# Patient Record
Sex: Male | Born: 1985 | Race: White | Hispanic: No | Marital: Married
Health system: Southern US, Community
[De-identification: ages and names within clinical notes are randomized; demographics above are authoritative.]

---

## 2006-02-19 ENCOUNTER — Emergency Department (HOSPITAL_COMMUNITY): Admission: EM | Admit: 2006-02-19 | Discharge: 2006-02-19 | Payer: Self-pay | Admitting: Emergency Medicine

## 2006-04-22 ENCOUNTER — Emergency Department (HOSPITAL_COMMUNITY): Admission: EM | Admit: 2006-04-22 | Discharge: 2006-04-22 | Payer: Self-pay | Admitting: Family Medicine

## 2006-04-25 ENCOUNTER — Emergency Department (HOSPITAL_COMMUNITY): Admission: EM | Admit: 2006-04-25 | Discharge: 2006-04-25 | Payer: Self-pay | Admitting: Emergency Medicine

## 2006-04-27 ENCOUNTER — Ambulatory Visit: Payer: Self-pay | Admitting: Internal Medicine

## 2006-05-05 ENCOUNTER — Ambulatory Visit: Payer: Self-pay | Admitting: Internal Medicine

## 2006-05-16 ENCOUNTER — Ambulatory Visit: Payer: Self-pay | Admitting: Internal Medicine

## 2007-07-14 IMAGING — CT CT HEAD W/O CM
1 series · 16 of 30 positions shown, 20 images · IV contrast (agent unspecified)
Comparison: None

CLINICAL DATA: Medical clearance.
 HEAD CT WITHOUT CONTRAST:
TECHNIQUE: Contiguous axial images were obtained from the base of the skull through the vertex according to standard protocol without contrast.

[Series 2: head_seq 4.5 h45s st · axial · 0.43mm/px · z∈[-180,-36]mm · 16 of 36 slices shown, 20 images]
[im 2/36  brain]
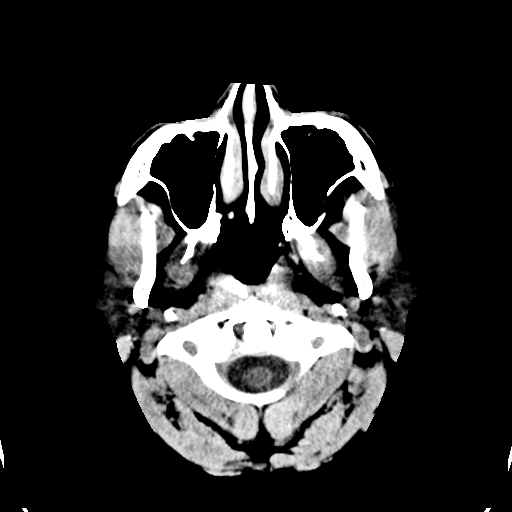
[im 2/36  bone]
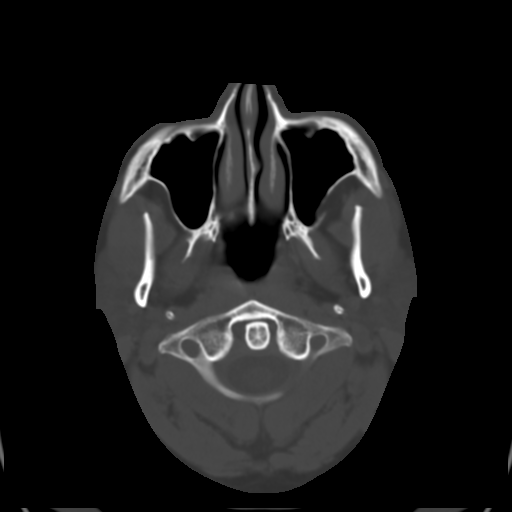
[im 4/36  brain]
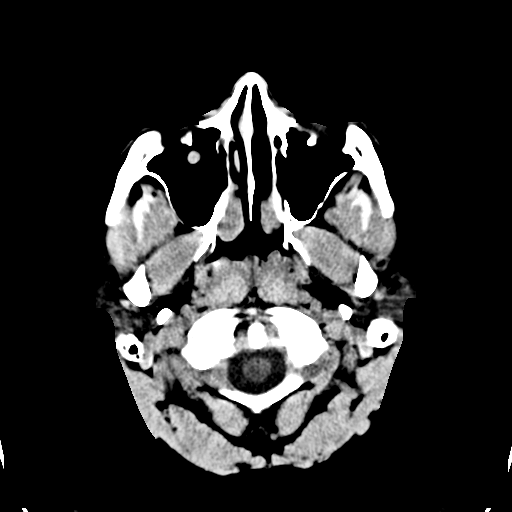
[im 7/36  brain]
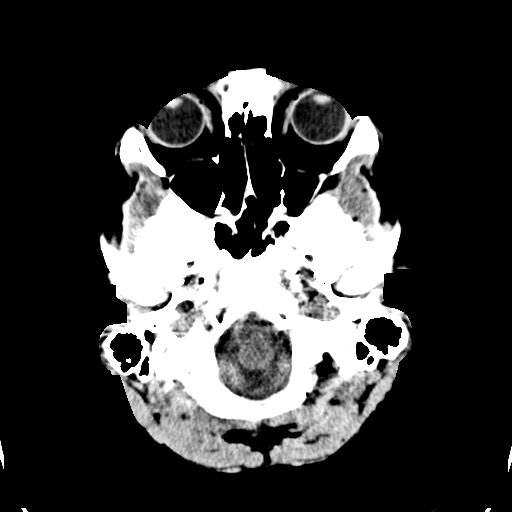
[im 9/36  brain]
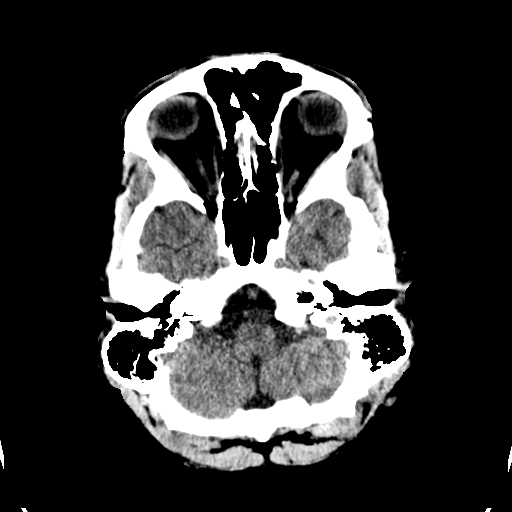
[im 10/36  brain]
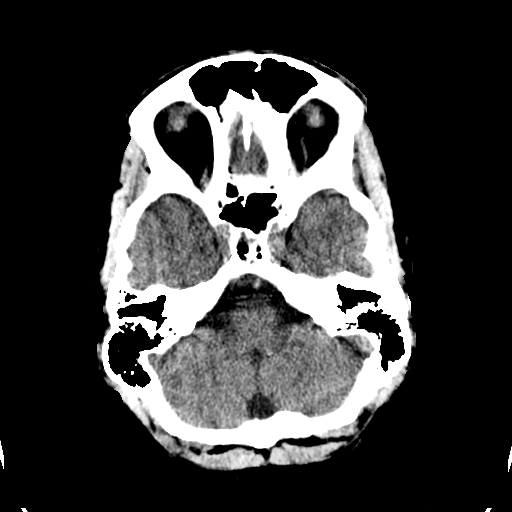
[im 10/36  bone]
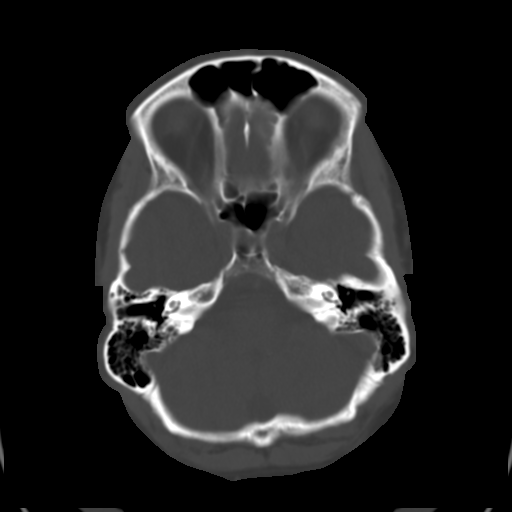
[im 13/36  brain]
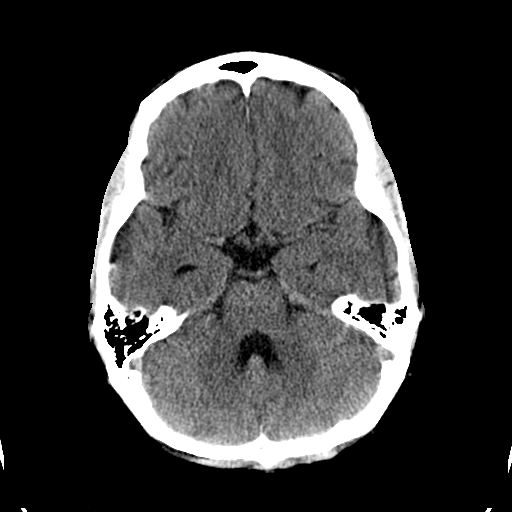
[im 15/36  brain]
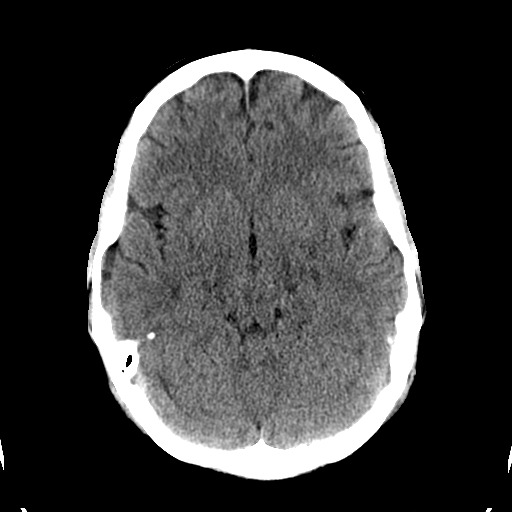
[im 17/36  brain]
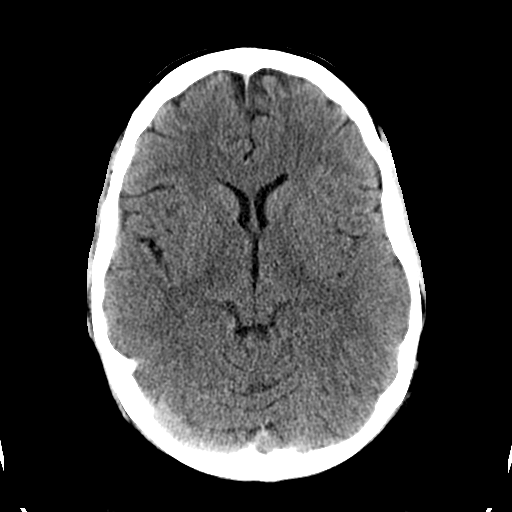
[im 19/36  brain]
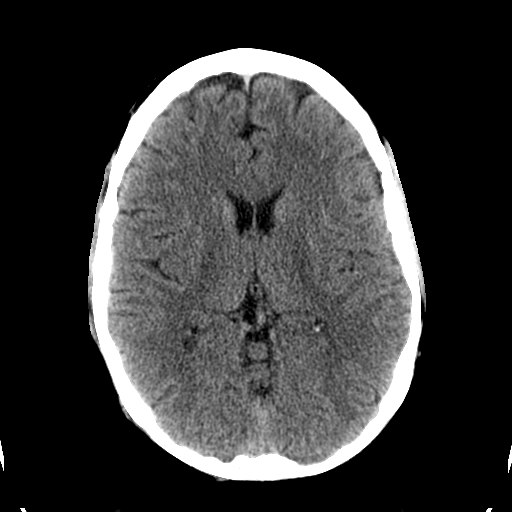
[im 19/36  bone]
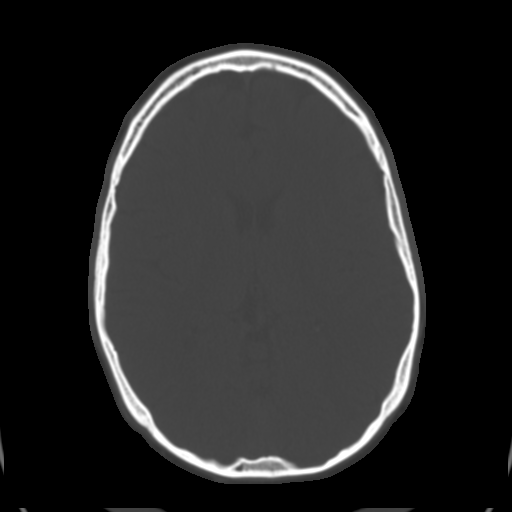
[im 21/36  brain]
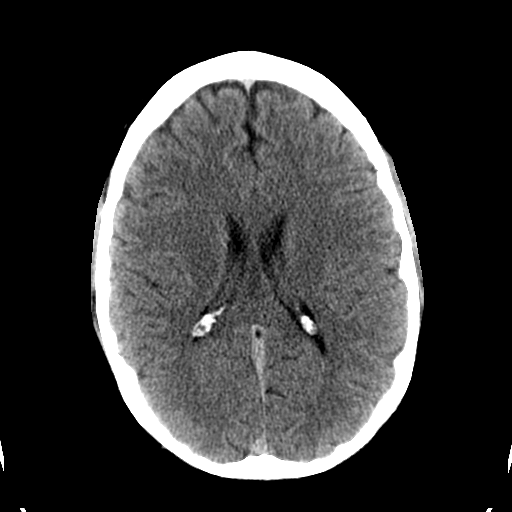
[im 23/36  brain]
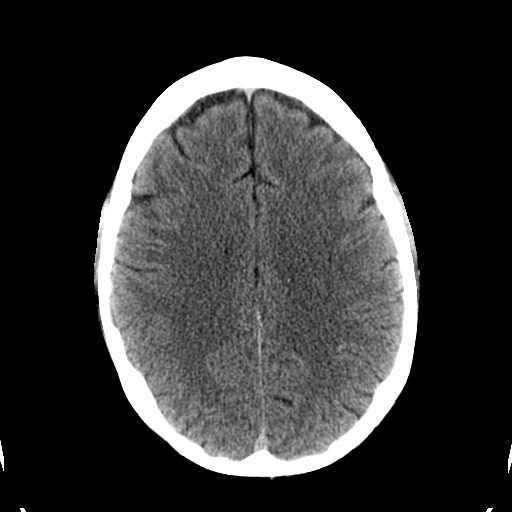
[im 26/36  brain]
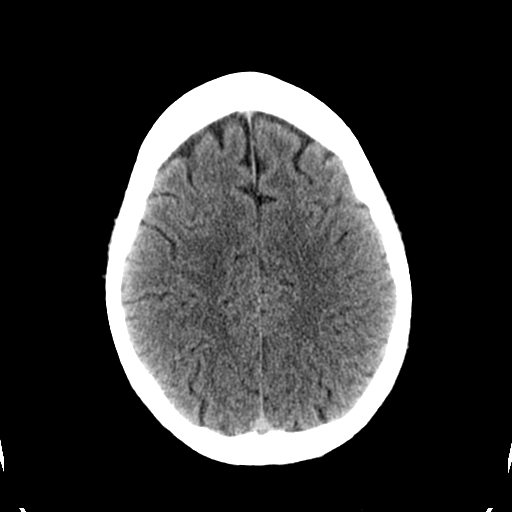
[im 27/36  brain]
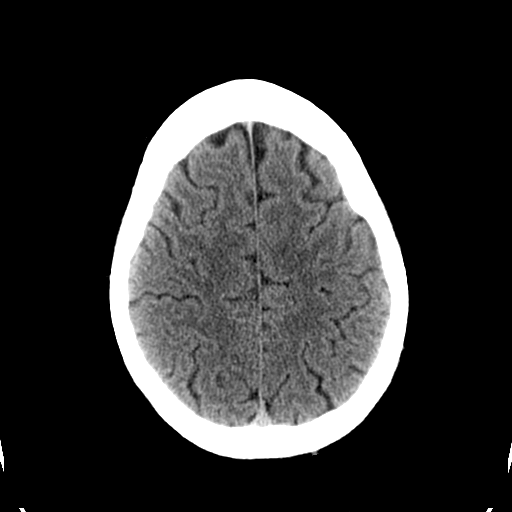
[im 27/36  bone]
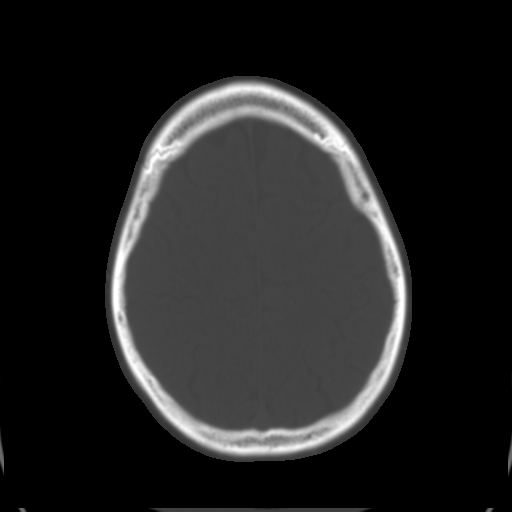
[im 29/36  brain]
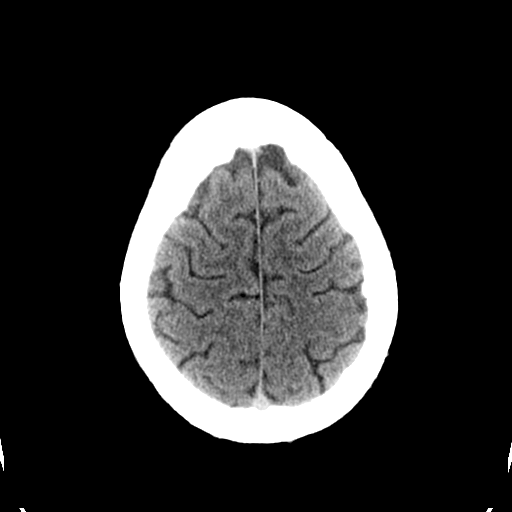
[im 32/36  brain]
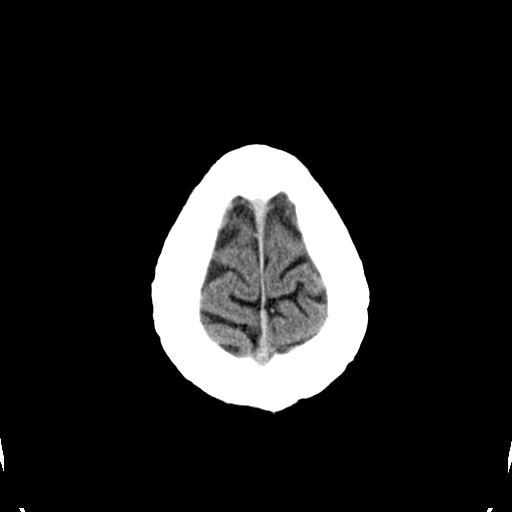
[im 34/36  brain]
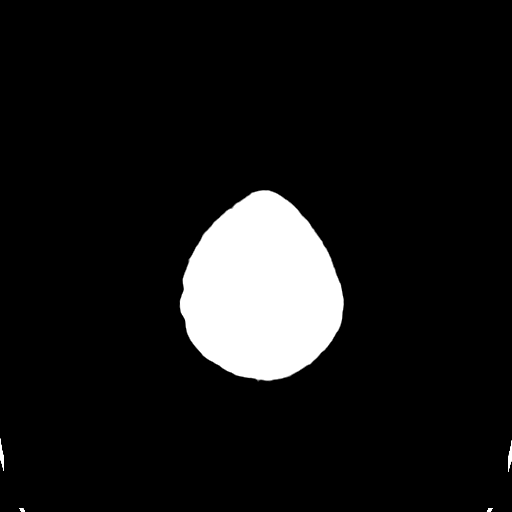

[16 of 30 positions shown; findings below may reference images not displayed]

FINDINGS: The brain parenchyma is normal in attenuation and morphology.  The ventricular volumes are within normal limits.  The midline is maintained.  There is no edema or mass effect.  
 The mastoid air cells and the paranasal sinuses are normally aerated.
IMPRESSION: No acute intracranial abnormalities.

## 2007-09-17 IMAGING — CR DG CHEST 2V
2 series · 2 of 2 positions shown · non-contrast
Comparison: 02/19/06.

CLINICAL DATA: Short of breath, cough.
 CHEST ? 2 VIEW:

[w chest pa]
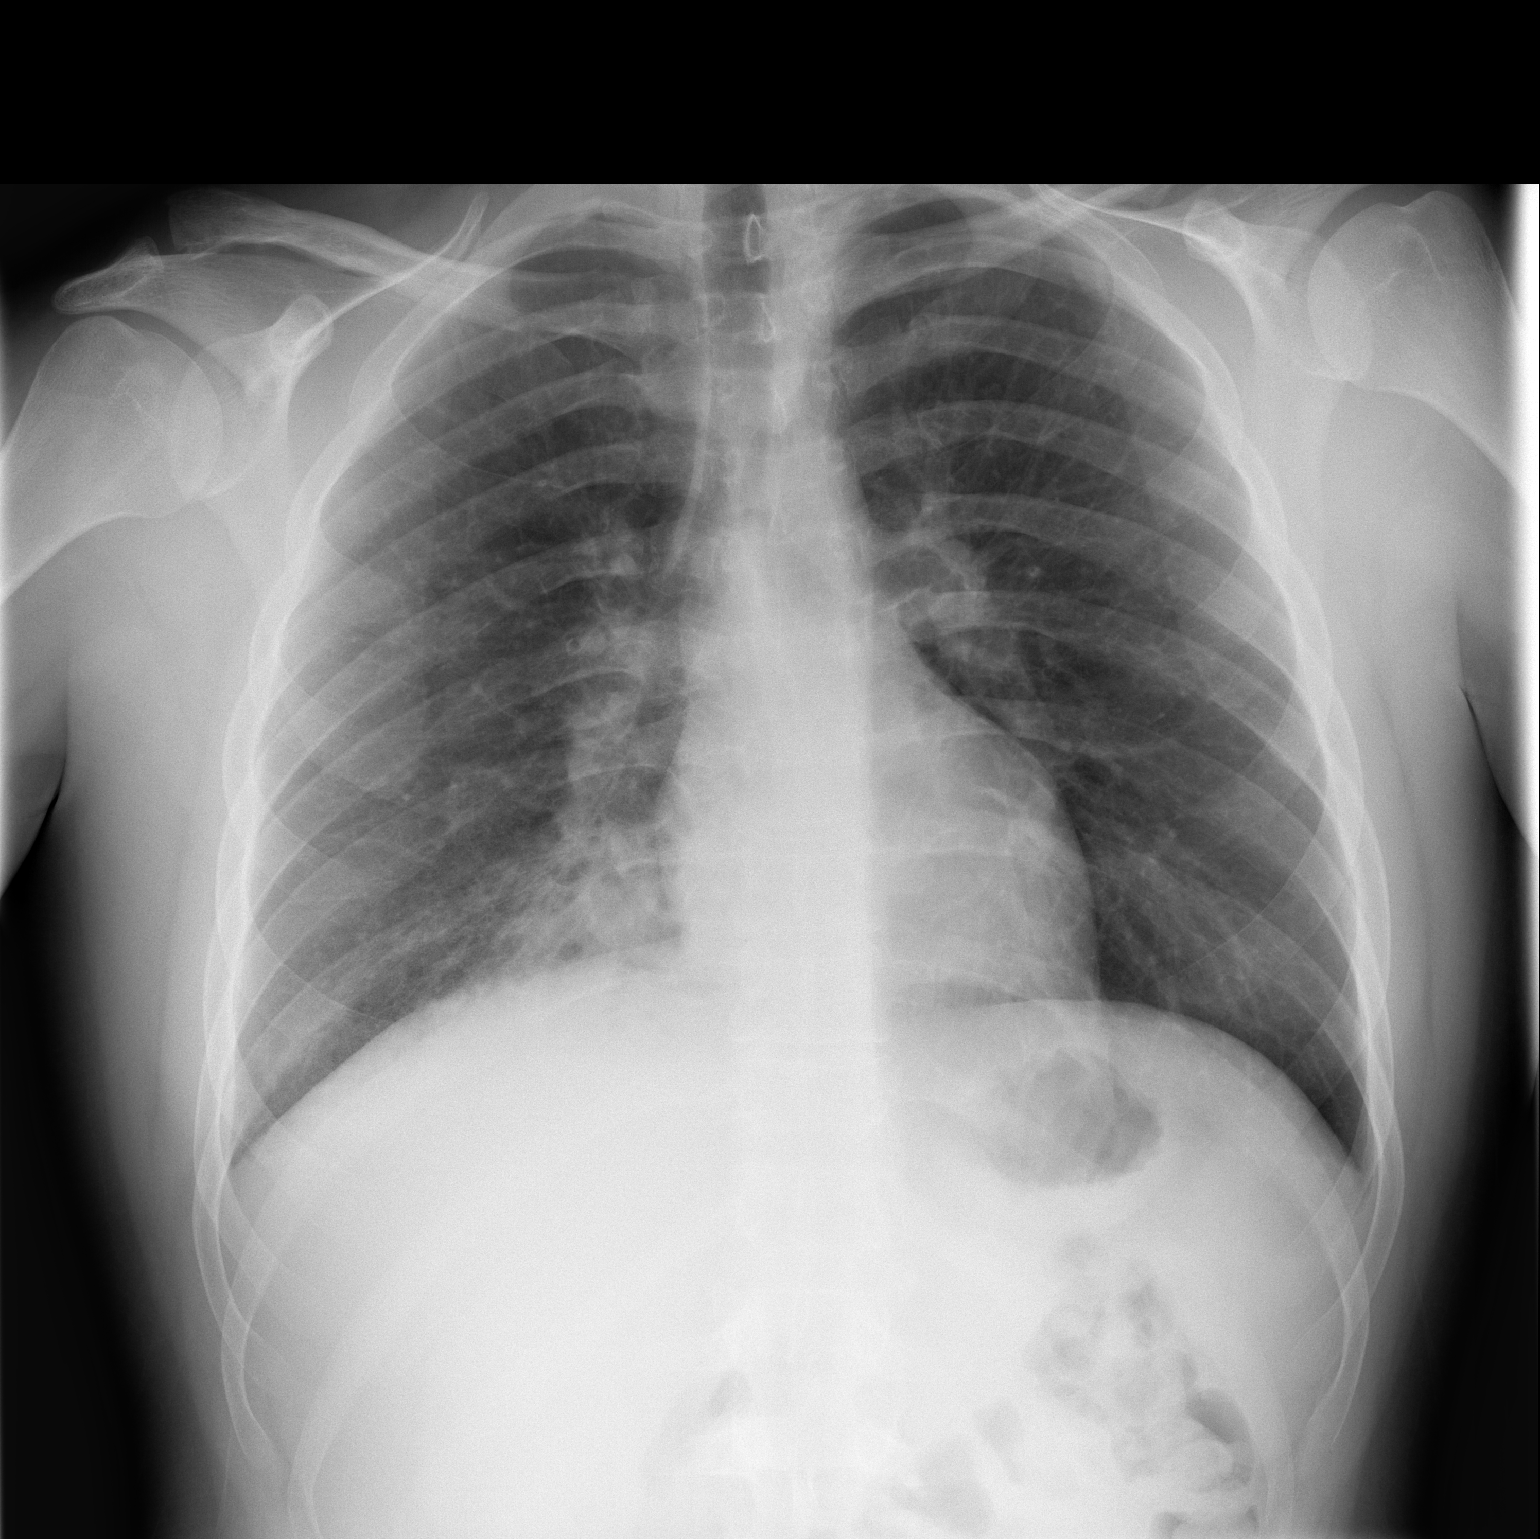

[w chest lat]
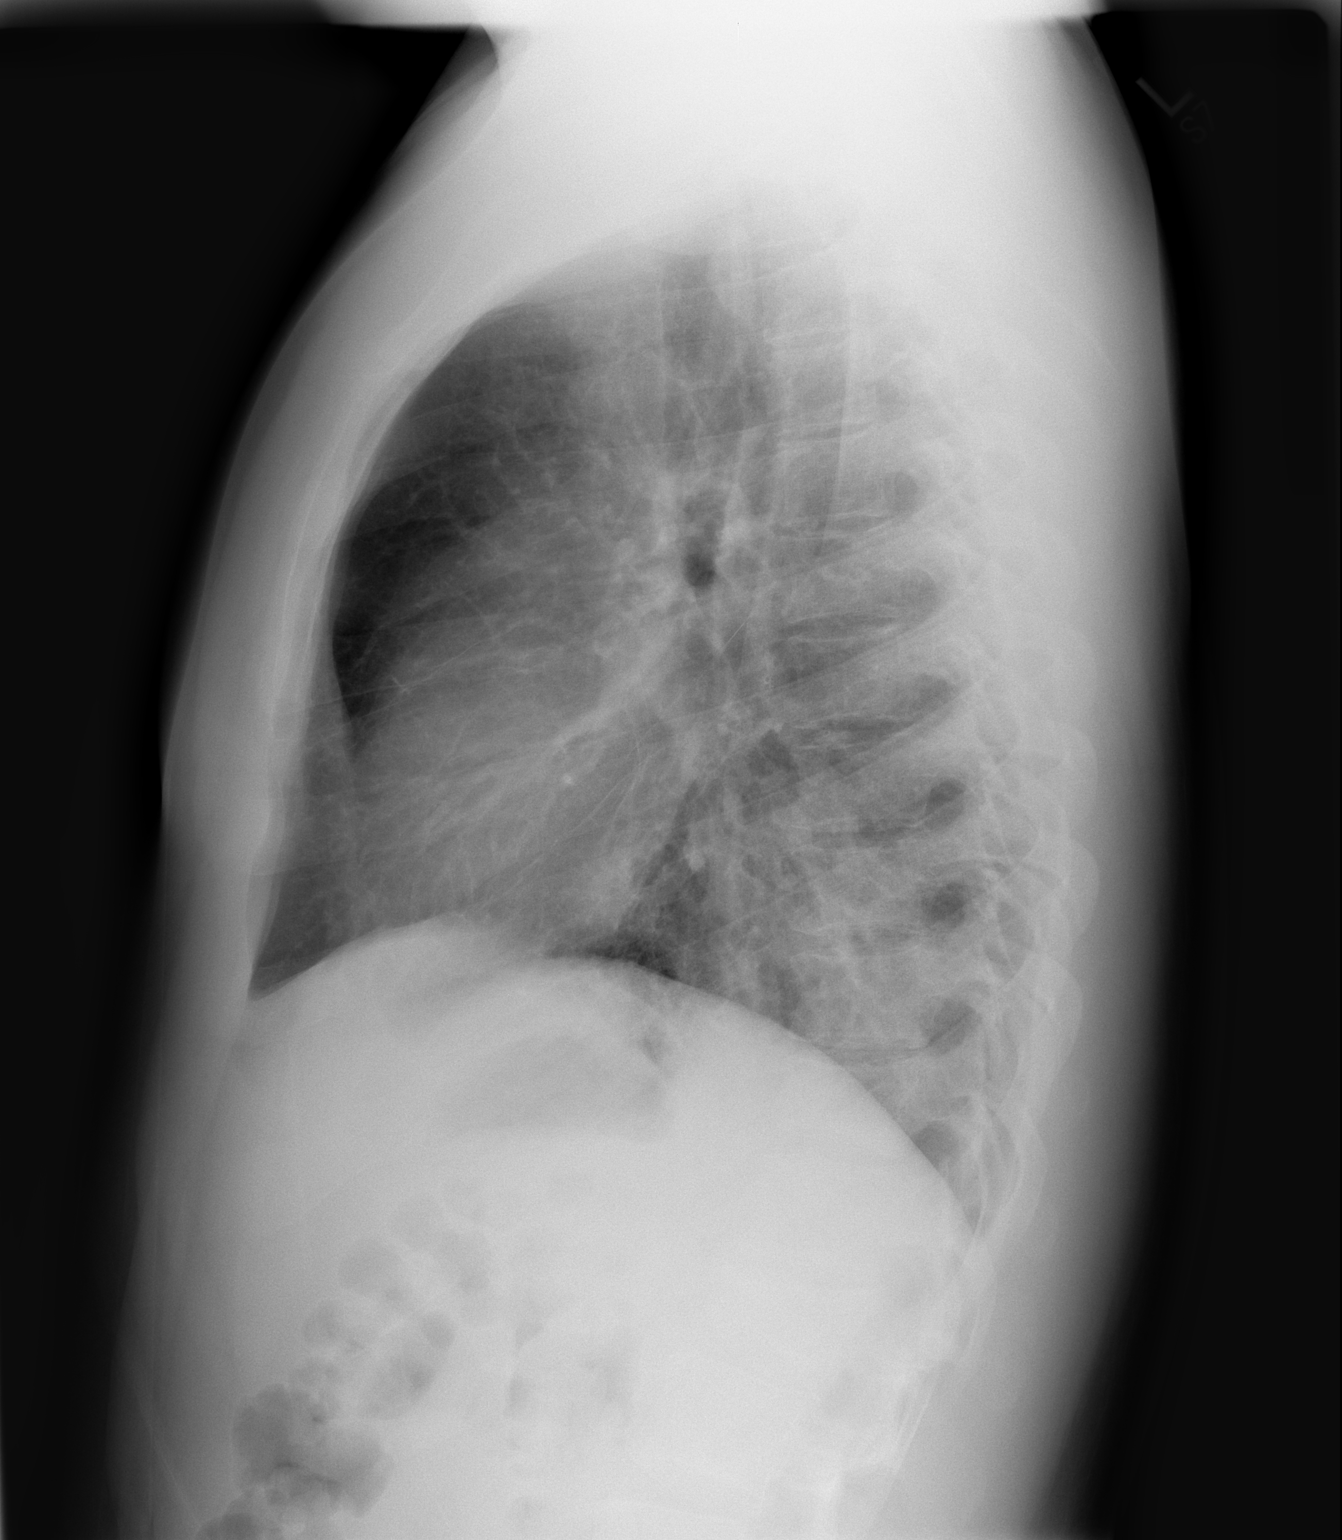

[2 of 2 positions shown; findings below may reference images not displayed]

FINDINGS: Two views of the chest show an opacity within the posteromedial right lower lobe consistent with pneumonia.  The remainder of the lungs is clear.  The heart is within normal limits in size.
IMPRESSION: Right lower lobe pneumonia.

## 2018-05-22 ENCOUNTER — Encounter (HOSPITAL_COMMUNITY): Payer: Self-pay | Admitting: Emergency Medicine

## 2018-05-22 ENCOUNTER — Emergency Department (HOSPITAL_COMMUNITY)
Admission: EM | Admit: 2018-05-22 | Discharge: 2018-05-22 | Disposition: A | Discharge status: Home / Self Care | Payer: Self-pay | Attending: Emergency Medicine | Admitting: Emergency Medicine

## 2018-05-22 DIAGNOSIS — L03012 Cellulitis of left finger: Secondary | ICD-10-CM | POA: Insufficient documentation

## 2018-05-22 MED ORDER — LIDOCAINE HCL 2 % IJ SOLN
10.0000 mL | Freq: Once | INTRAMUSCULAR | Status: AC
  Administered 2018-05-22: 200 mg via INTRADERMAL
  Filled 2018-05-22: qty 20

## 2018-05-22 MED ORDER — DOXYCYCLINE HYCLATE 100 MG PO CAPS: 100.0000 mg | ORAL_CAPSULE | Freq: Two times a day (BID) | ORAL | 0 refills | Status: AC

## 2018-05-22 NOTE — ED Notes (Signed)
Pt stable and ambulatory for discharge, states understanding follow up.  

## 2018-05-22 NOTE — ED Triage Notes (Signed)
Pt here from home with c/o pointer finger swollen and red from working , got worse last night

## 2018-05-22 NOTE — ED Provider Notes (Signed)
[facility]   CSN: [id number] Arrival date & time: 05/22/18  1415     History   Chief Complaint Chief Complaint  Patient presents with  . Finger Injury    HPI Alan DownerBrian Loma is a 32 y.o. male.  HPI   Patient is a 32 year old male with no significant past medical history presents the emergency department today complaining of pain to the left index finger that has been ongoing for several days but seem to worsen last night.  Rates his pain 11/10.  Describes it as a throbbing pain.  Pain is constant.  Worse with palpation.  Has not tried taking any medications at home for his symptoms.  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  Denies any drainage from the area.  No fevers or chills.  History reviewed. No pertinent past medical history.  There are no active problems to display for this patient.   History reviewed. No pertinent surgical history.    Home Medications    Prior to Admission medications   Medication Sig Start Date End Date Taking? Authorizing Provider  doxycycline (VIBRAMYCIN) 100 MG capsule Take 1 capsule (100 mg total) by mouth 2 (two) times daily for 7 days. 05/22/18 05/29/18  Adaline Trejos S, PA-C    Family History No family history on file.  Social History Social History   Tobacco Use  . Smoking status: Not on file  Substance Use Topics  . Alcohol use: Not on file  . Drug use: Not on file     Allergies   Patient has no allergy information on record.   Review of Systems Review of Systems  Constitutional: Negative for chills and fever.  Musculoskeletal:       Left index finger pain  Skin: Positive for wound.  Neurological: Negative for numbness.     Physical Exam Updated Vital Signs BP 138/87 (BP Location: Right Arm)   Pulse 87   Temp 98.4 F (36.9 C) (Oral)   Resp 18   SpO2 97%    Physical Exam Constitutional:      General: He is not in acute distress.    Appearance: He is well-developed.  Eyes:     Conjunctiva/sclera: Conjunctivae normal.  Cardiovascular:     Rate and Rhythm: Normal rate and regular rhythm.  Pulmonary:     Effort: Pulmonary effort is normal.     Breath sounds: Normal breath sounds.  Musculoskeletal:     Comments: TTP to the distal aspect of the left index finger just adjacent to the nailbed. There is some white discoloration to the skin suspicious for purulent fluid collection  Skin:    General: Skin is warm and dry.  Neurological:     Mental Status: He is alert and oriented to person, place, and time.      ED Treatments / Results  Labs (all labs ordered are listed, but only abnormal results are displayed) Labs Reviewed - No data to display  EKG None  Radiology No results found.  Procedures .Marland Kitchen.Incision and Drainage Date/Time: 05/22/2018 3:45 PM Performed by: Karrie Meresouture, Contrina Orona S, PA-C Authorized by: Karrie Meresouture, Draper Gallon S, PA-C   Consent:    Consent obtained:  Verbal   Consent given by:  Patient   Risks discussed:  Bleeding, incomplete drainage and pain Location:    Indications for incision and drainage: paronychia.   Location: finger. Pre-procedure details:  Skin preparation:  Betadine Anesthesia (see MAR for exact dosages):    Anesthesia method:  Nerve block   Block needle gauge:  27 G   Block anesthetic:  Lidocaine 2% w/o epi   Block injection procedure:  Anatomic landmarks identified, introduced needle, incremental injection, negative aspiration for blood and anatomic landmarks palpated   Block outcome:  Anesthesia achieved Procedure type:    Complexity:  Simple Procedure details:    Incision types:  Stab incision   Scalpel blade:  11   Drainage:  Purulent   Drainage amount:  Scant   Wound treatment:  Wound left open   Packing materials:  None Post-procedure details:    Patient tolerance of procedure:  Tolerated  well, no immediate complications   (including critical care time)  Medications Ordered in ED Medications  lidocaine (XYLOCAINE) 2 % (with pres) injection 200 mg (has no administration in time range)     Initial Impression / Assessment and Plan / ED Course  I have reviewed the triage vital signs and the nursing notes.  Pertinent labs & imaging results that were available during my care of the patient were reviewed by me and considered in my medical decision making (see chart for details).     Final Clinical Impressions(s) / ED Diagnoses   Final diagnoses:  Paronychia of finger of left hand   Patient with paronychia and fluid collection underneath the nail of the left index finger.  Area was I&D in the ED.  Because of high likelihood of contamination from caulking, patient will be sent home with course of antibiotics to prevent worsening infection.  Advised warm soaks at home.  Advised return if worse.  Patient voices understanding of the plan reasons to return.  All questions answered.  ED Discharge Orders         Ordered    doxycycline (VIBRAMYCIN) 100 MG capsule  2 times daily     05/22/18 [id number]1544           Nguyen Todorov S, PA-C 05/22/18 1546    Cathren LaineSteinl, Kevin, MD 05/22/18 1606

## 2018-05-22 NOTE — ED Notes (Signed)
Pt complains of pain to index finger on left hand for several days after getting "cauking" underneath his finger nail. Pt is a Education administratorpainter.

## 2018-05-22 NOTE — Discharge Instructions (Addendum)
You were given a prescription for antibiotics. Please take the antibiotic prescription fully.   Please follow up with your primary care provider within 5-7 days for re-evaluation of your symptoms. If you do not have a primary care provider, information for a healthcare clinic has been provided for you to make arrangements for follow up care.  Please return to the emergency room immediately if you experience any new or worsening symptoms or any symptoms that indicate worsening infection such as fevers, increased redness/swelling/pain, warmth, or drainage from the affected area.
